# Patient Record
Sex: Male | Born: 1980 | Race: White | Hispanic: No | Marital: Single | Smoking: Current every day smoker
Health system: Southern US, Community
[De-identification: ages and names within clinical notes are randomized; demographics above are authoritative.]

---

## 2012-09-14 ENCOUNTER — Emergency Department (HOSPITAL_COMMUNITY): Payer: Self-pay

## 2012-09-14 ENCOUNTER — Emergency Department (HOSPITAL_COMMUNITY)
Admission: EM | Admit: 2012-09-14 | Discharge: 2012-09-14 | Disposition: A | Discharge status: Home / Self Care | Payer: Self-pay | Attending: Emergency Medicine | Admitting: Emergency Medicine

## 2012-09-14 ENCOUNTER — Encounter (HOSPITAL_COMMUNITY): Payer: Self-pay | Admitting: Emergency Medicine

## 2012-09-14 DIAGNOSIS — S298XXA Other specified injuries of thorax, initial encounter: Secondary | ICD-10-CM | POA: Insufficient documentation

## 2012-09-14 DIAGNOSIS — S0181XA Laceration without foreign body of other part of head, initial encounter: Secondary | ICD-10-CM

## 2012-09-14 DIAGNOSIS — R110 Nausea: Secondary | ICD-10-CM | POA: Insufficient documentation

## 2012-09-14 DIAGNOSIS — S0180XA Unspecified open wound of other part of head, initial encounter: Secondary | ICD-10-CM | POA: Insufficient documentation

## 2012-09-14 DIAGNOSIS — S022XXA Fracture of nasal bones, initial encounter for closed fracture: Secondary | ICD-10-CM | POA: Insufficient documentation

## 2012-09-14 LAB — URINE MICROSCOPIC-ADD ON

## 2012-09-14 LAB — URINALYSIS, ROUTINE W REFLEX MICROSCOPIC
Bilirubin Urine: NEGATIVE
Glucose, UA: NEGATIVE mg/dL
Ketones, ur: 40 mg/dL — AB
Protein, ur: NEGATIVE mg/dL

## 2012-09-14 MED ORDER — TETANUS-DIPHTH-ACELL PERTUSSIS 5-2.5-18.5 LF-MCG/0.5 IM SUSP
0.5000 mL | Freq: Once | INTRAMUSCULAR | Status: DC
  Filled 2012-09-14: qty 0.5

## 2012-09-14 MED ORDER — HYDROCODONE-ACETAMINOPHEN 5-325 MG PO TABS: 1.0000 | ORAL_TABLET | ORAL | Status: DC | PRN

## 2012-09-14 MED ORDER — ONDANSETRON 4 MG PO TBDP
8.0000 mg | ORAL_TABLET | Freq: Once | ORAL | Status: AC
  Administered 2012-09-14: 8 mg via ORAL
  Filled 2012-09-14: qty 2

## 2012-09-14 MED ORDER — OXYCODONE-ACETAMINOPHEN 5-325 MG PO TABS
2.0000 | ORAL_TABLET | Freq: Once | ORAL | Status: AC
  Administered 2012-09-14: 2 via ORAL
  Filled 2012-09-14: qty 2
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
## 2012-09-14 NOTE — ED Notes (Signed)
Patient back from CT; currently sitting up in bed; no respiratory or acute distress noted.  Patient updated on plan of care; informed patient that we are currently waiting on x-ray results to come back.  Patient given water, per request (OK by EDP).  Patient denies any other needs at this time; will continue to monitor.
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
## 2012-09-14 NOTE — ED Notes (Signed)
Pt given basin with water and peroxide to clean face off before MD sutures. sts understands.

## 2012-09-14 NOTE — ED Notes (Signed)
PA at bedside suturing.

## 2012-09-14 NOTE — ED Notes (Signed)
Patient currently sitting up in bed; no respiratory or acute distress noted.  Patient updated on plan of care; informed patient that he will be sent for CT scans and x-rays; patient denies any needs at this time; will continue to monitor.

## 2012-09-14 NOTE — ED Notes (Signed)
Patient currently asleep in bed; no respiratory or acute distress noted. Patient pending EDP exam at this time; will continue to monitor.
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
## 2014-02-21 ENCOUNTER — Encounter (HOSPITAL_COMMUNITY): Payer: Self-pay | Admitting: Emergency Medicine

## 2014-02-21 ENCOUNTER — Emergency Department (HOSPITAL_COMMUNITY): Payer: Self-pay

## 2014-02-21 ENCOUNTER — Emergency Department (HOSPITAL_COMMUNITY)
Admission: EM | Admit: 2014-02-21 | Discharge: 2014-02-21 | Disposition: A | Discharge status: Home / Self Care | Payer: Self-pay | Attending: Emergency Medicine | Admitting: Emergency Medicine

## 2014-02-21 DIAGNOSIS — S61311A Laceration without foreign body of left index finger with damage to nail, initial encounter: Secondary | ICD-10-CM

## 2014-02-21 DIAGNOSIS — F17200 Nicotine dependence, unspecified, uncomplicated: Secondary | ICD-10-CM | POA: Insufficient documentation

## 2014-02-21 DIAGNOSIS — Z23 Encounter for immunization: Secondary | ICD-10-CM | POA: Insufficient documentation

## 2014-02-21 DIAGNOSIS — S61213A Laceration without foreign body of left middle finger without damage to nail, initial encounter: Secondary | ICD-10-CM

## 2014-02-21 DIAGNOSIS — Z79899 Other long term (current) drug therapy: Secondary | ICD-10-CM | POA: Insufficient documentation

## 2014-02-21 DIAGNOSIS — S61209A Unspecified open wound of unspecified finger without damage to nail, initial encounter: Secondary | ICD-10-CM | POA: Insufficient documentation

## 2014-02-21 DIAGNOSIS — S61212A Laceration without foreign body of right middle finger without damage to nail, initial encounter: Secondary | ICD-10-CM

## 2014-02-21 LAB — BASIC METABOLIC PANEL
Anion gap: 17 — ABNORMAL HIGH (ref 5–15)
BUN: 11 mg/dL (ref 6–23)
CALCIUM: 8.2 mg/dL — AB (ref 8.4–10.5)
CO2: 19 mEq/L (ref 19–32)
Chloride: 107 mEq/L (ref 96–112)
Creatinine, Ser: 0.89 mg/dL (ref 0.50–1.35)
GLUCOSE: 118 mg/dL — AB (ref 70–99)
POTASSIUM: 4.1 meq/L (ref 3.7–5.3)
SODIUM: 143 meq/L (ref 137–147)

## 2014-02-21 LAB — CBC WITH DIFFERENTIAL/PLATELET
BASOS ABS: 0 10*3/uL (ref 0.0–0.1)
BASOS PCT: 0 % (ref 0–1)
EOS ABS: 0 10*3/uL (ref 0.0–0.7)
EOS PCT: 0 % (ref 0–5)
HCT: 38.1 % — ABNORMAL LOW (ref 39.0–52.0)
Hemoglobin: 13.3 g/dL (ref 13.0–17.0)
LYMPHS ABS: 2.2 10*3/uL (ref 0.7–4.0)
Lymphocytes Relative: 13 % (ref 12–46)
MCH: 32.3 pg (ref 26.0–34.0)
MCHC: 34.9 g/dL (ref 30.0–36.0)
MCV: 92.5 fL (ref 78.0–100.0)
Monocytes Absolute: 1.2 10*3/uL — ABNORMAL HIGH (ref 0.1–1.0)
Monocytes Relative: 7 % (ref 3–12)
NEUTROS PCT: 80 % — AB (ref 43–77)
Neutro Abs: 14.1 10*3/uL — ABNORMAL HIGH (ref 1.7–7.7)
PLATELETS: 200 10*3/uL (ref 150–400)
RBC: 4.12 MIL/uL — ABNORMAL LOW (ref 4.22–5.81)
RDW: 12 % (ref 11.5–15.5)
WBC: 17.6 10*3/uL — ABNORMAL HIGH (ref 4.0–10.5)

## 2014-02-21 MED ORDER — FENTANYL CITRATE 0.05 MG/ML IJ SOLN
50.0000 ug | Freq: Once | INTRAMUSCULAR | Status: AC
  Administered 2014-02-21: 50 ug via INTRAVENOUS
  Filled 2014-02-21: qty 2

## 2014-02-21 MED ORDER — ONDANSETRON 4 MG PO TBDP
8.0000 mg | ORAL_TABLET | Freq: Once | ORAL | Status: AC
  Administered 2014-02-21: 8 mg via ORAL
  Filled 2014-02-21: qty 2

## 2014-02-21 MED ORDER — ONDANSETRON 8 MG PO TBDP: 8.0000 mg | ORAL_TABLET | Freq: Three times a day (TID) | ORAL | Status: AC | PRN

## 2014-02-21 MED ORDER — CEPHALEXIN 500 MG PO CAPS: 500.0000 mg | ORAL_CAPSULE | Freq: Four times a day (QID) | ORAL | Status: AC

## 2014-02-21 MED ORDER — SODIUM CHLORIDE 0.9 % IV BOLUS (SEPSIS)
1000.0000 mL | Freq: Once | INTRAVENOUS | Status: AC
  Administered 2014-02-21: 1000 mL via INTRAVENOUS

## 2014-02-21 MED ORDER — TETANUS-DIPHTH-ACELL PERTUSSIS 5-2.5-18.5 LF-MCG/0.5 IM SUSP
0.5000 mL | Freq: Once | INTRAMUSCULAR | Status: AC
  Administered 2014-02-21: 0.5 mL via INTRAMUSCULAR
  Filled 2014-02-21: qty 0.5

## 2014-02-21 MED ORDER — OXYCODONE-ACETAMINOPHEN 5-325 MG PO TABS: 2.0000 | ORAL_TABLET | ORAL | Status: AC | PRN

## 2014-02-21 NOTE — ED Notes (Signed)
Kerlix and coban applied to each sutured digit after cleaning with normal saline, and reveiwed with patient how to care for extremities at home.  Provided extra dressings for use also.

## 2014-02-21 NOTE — ED Notes (Signed)
Called Dr. Norlene Campbell in regards to patient's bleeding with dressing in place, redressed for control. She will see him soon.

## 2014-02-21 NOTE — ED Notes (Signed)
Dr. Norlene Campbelltter at the bedside to assess hands, patient placed in [place] and 1 bolus of NS started due to BP of 87/56. P 97

## 2014-02-21 NOTE — ED Notes (Signed)
Patient still off the unit for testing 

## 2014-02-21 NOTE — ED Provider Notes (Signed)
5:27 AM  LACERATION REPAIR Performed by: Emilia BeckKaitlyn Wesson Stith Authorized by: Emilia BeckKaitlyn Juvenal Umar Consent: Verbal consent obtained. Risks and benefits: risks, benefits and alternatives were discussed Consent given by: patient Patient identity confirmed: provided demographic data Prepped and Draped in normal sterile fashion Wound explored  Laceration Location: left dorsal middle finger  Laceration Length: 5 cm  No Foreign Bodies seen or palpated  Anesthesia: local infiltration  Local anesthetic: lidocaine 1% without epinephrine  Anesthetic total: 2 ml  Irrigation method: syringe Amount of cleaning: standard  Skin closure: 4-0 prolene  Number of sutures: 5  Technique: simple  Patient tolerance: Patient tolerated the procedure well with no immediate complications.  LACERATION REPAIR Performed by: Emilia BeckKaitlyn Caralyn Twining Authorized by: Emilia BeckKaitlyn Aashika Carta Consent: Verbal consent obtained. Risks and benefits: risks, benefits and alternatives were discussed Consent given by: patient Patient identity confirmed: provided demographic data Prepped and Draped in normal sterile fashion Wound explored  Laceration Location: right distal middle finger  Laceration Length: 3 cm  No Foreign Bodies seen or palpated  Anesthesia: local infiltration  Local anesthetic: lidocaine 1% without epinephrine  Anesthetic total: 2 ml  Irrigation method: syringe Amount of cleaning: standard  Skin closure: 4-0 prolene  Number of sutures: 3  Technique: simple  Patient tolerance: Patient tolerated the procedure well with no immediate complications.   Emilia BeckKaitlyn Marvel Sapp, [place] JerseyPA-C 02/21/14 [phone]

## 2014-02-21 NOTE — ED Notes (Signed)
Reinforced dressing over left hand to control blood.

## 2014-02-21 NOTE — ED Notes (Signed)
Patient has called his father and provided him the last for of med rec number for visitation privilages.  Father's name is "Reuel BoomDaniel" or "Dan".

## 2014-02-21 NOTE — Discharge Instructions (Signed)
Watch for signs of infection:  Redness, swelling, drainage of pus.  Take medications as prescribed.  Follow up with Dr Izora Ribas, call on Monday for appointment.  Suture removal in 5-7 days   Fingernail or Toenail Loss All or part of your fingernail or toenail has been lost. This may or may not grow back as a normal nail. A special non-stick bandage has been put on your finger or toe tightly to prevent bleeding. HOME CARE INSTRUCTIONS  The tips of fingers and toes are full of nerves and injuries are often very painful. The following will help you decrease the pain and obtain the best outcome.  Keep your hand or foot elevated above your heart to relieve pain and swelling. This will require lying in bed or on a couch with the hand or leg on pillows or sitting in a recliner with the leg up. Letting your hand or leg dangle may increase swelling, slow healing and cause throbbing pain.  Keep your dressing dry and clean.  Change your bandage in 24 hours after going home.  After your bandage is changed, soak your hand or foot in warm soapy water for 10 to 20 minutes. Do this 3 times per day. This helps reduce pain and swelling. After soaking, apply a clean, dry bandage. Change your bandage if it is wet or dirty.  Only take over-the-counter or prescription medicines for pain, discomfort, or fever as directed by your caregiver.  See your caregiver as needed for problems. SEEK IMMEDIATE MEDICAL CARE IF:   You have increased pain, swelling, drainage, or bleeding.  You have a fever. MAKE SURE YOU:   Understand these instructions.  Will watch your condition.  Will get help right away if you are not doing well or get worse. Document Released: 05/11/2006 Document Revised: 09/11/2011 Document Reviewed: 07/31/2006 [facility] Patient Information 2015 [place], [place]. This information is not intended to replace advice given to you by your health care provider. Make sure you discuss any questions you have with  your health care provider.  Delayed Wound Closure Sometimes, your health care provider will decide to delay closing a wound for several days. This is done when the wound is badly bruised, dirty, or when it has been several hours since the injury happened. By delaying the closure of your wound, the risk of infection is reduced. Wounds that are closed in 3-7 days after being cleaned up and dressed heal just as well as those that are closed right away. HOME CARE INSTRUCTIONS  Rest and elevate the injured area until the pain and swelling are gone.  Have your wound checked as instructed by your health care provider. SEEK MEDICAL CARE IF:  You develop unusual or increased swelling or redness around the wound.  You have increasing pain or tenderness.  There is increasing fluid (drainage) or a bad smelling drainage coming from the wound. Document Released: 06/19/2005 Document Revised: 06/24/2013 Document Reviewed: 12/17/2012 [facility] Patient Information 2015 [place], [place]. This information is not intended to replace advice given to you by your health care provider. Make sure you discuss any questions you have with your health care provider.  Finger Avulsion  When the tip of the finger is lost, a new nail may grow back if part of the fingernail is left. The new nail may be deformed. If just the tip of the finger is lost, no repair may be needed unless there is bone showing. If bone is showing, your caregiver may need to remove the protruding bone  and put on a bandage. Your caregiver will do what is best for you. Most of the time when a fingertip is lost, the end will gradually grow back on and look fairly normal, but it may remain sensitive to pressure and temperature extremes for a long time. HOME CARE INSTRUCTIONS   Keep your hand elevated above your heart to relieve pain and swelling.  Keep your dressing dry and clean.  Change your bandage in 24 hours or as directed.  Only take  over-the-counter or prescription medicines for pain, discomfort, or fever as directed by your caregiver.  See your caregiver as needed for problems. SEEK MEDICAL CARE IF:   You have increased pain, swelling, drainage, or bleeding.  You have a fever.  You have swelling that spreads from your finger and into your hand. Make sure to check to see if you need a tetanus booster. Document Released: 08/28/2001 Document Revised: 12/19/2011 Document Reviewed: 07/23/2008 [facility] Patient Information 2015 [place], [place]. This information is not intended to replace advice given to you by your health care provider. Make sure you discuss any questions you have with your health care provider.  Laceration Care, Adult A laceration is a cut or lesion that goes through all layers of the skin and into the tissue just beneath the skin. TREATMENT  Some lacerations may not require closure. Some lacerations may not be able to be closed due to an increased risk of infection. It is important to see your caregiver as soon as possible after an injury to minimize the risk of infection and maximize the opportunity for successful closure. If closure is appropriate, pain medicines may be given, if needed. The wound will be cleaned to help prevent infection. Your caregiver will use stitches (sutures), staples, wound glue (adhesive), or skin adhesive strips to repair the laceration. These tools bring the skin edges together to allow for faster healing and a better cosmetic outcome. However, all wounds will heal with a scar. Once the wound has healed, scarring can be minimized by covering the wound with sunscreen during the day for 1 full year. HOME CARE INSTRUCTIONS  For sutures or staples:  Keep the wound clean and dry.  If you were given a bandage (dressing), you should change it at least once a day. Also, change the dressing if it becomes wet or dirty, or as directed by your caregiver.  Wash the wound with soap and water  2 times a day. Rinse the wound off with water to remove all soap. Pat the wound dry with a clean towel.  After cleaning, apply a thin layer of the antibiotic ointment as recommended by your caregiver. This will help prevent infection and keep the dressing from sticking.  You may shower as usual after the first 24 hours. Do not soak the wound in water until the sutures are removed.  Only take over-the-counter or prescription medicines for pain, discomfort, or fever as directed by your caregiver.  Get your sutures or staples removed as directed by your caregiver. For skin adhesive strips:  Keep the wound clean and dry.  Do not get the skin adhesive strips wet. You may bathe carefully, using caution to keep the wound dry.  If the wound gets wet, pat it dry with a clean towel.  Skin adhesive strips will fall off on their own. You may trim the strips as the wound heals. Do not remove skin adhesive strips that are still stuck to the wound. They will fall off in time. For  wound adhesive:  You may briefly wet your wound in the shower or bath. Do not soak or scrub the wound. Do not swim. Avoid periods of heavy perspiration until the skin adhesive has fallen off on its own. After showering or bathing, gently pat the wound dry with a clean towel.  Do not apply liquid medicine, cream medicine, or ointment medicine to your wound while the skin adhesive is in place. This may loosen the film before your wound is healed.  If a dressing is placed over the wound, be careful not to apply tape directly over the skin adhesive. This may cause the adhesive to be pulled off before the wound is healed.  Avoid prolonged exposure to sunlight or tanning lamps while the skin adhesive is in place. Exposure to ultraviolet light in the first year will darken the scar.  The skin adhesive will usually remain in place for 5 to 10 days, then naturally fall off the skin. Do not pick at the adhesive film. You may need a  tetanus shot if:  You cannot remember when you had your last tetanus shot.  You have never had a tetanus shot. If you get a tetanus shot, your arm may swell, get red, and feel warm to the touch. This is common and not a problem. If you need a tetanus shot and you choose not to have one, there is a rare chance of getting tetanus. Sickness from tetanus can be serious. SEEK MEDICAL CARE IF:   You have redness, swelling, or increasing pain in the wound.  You see a red line that goes away from the wound.  You have yellowish-white fluid (pus) coming from the wound.  You have a fever.  You notice a bad smell coming from the wound or dressing.  Your wound breaks open before or after sutures have been removed.  You notice something coming out of the wound such as wood or glass.  Your wound is on your hand or foot and you cannot move a finger or toe. SEEK IMMEDIATE MEDICAL CARE IF:   Your pain is not controlled with prescribed medicine.  You have severe swelling around the wound causing pain and numbness or a change in color in your arm, hand, leg, or foot.  Your wound splits open and starts bleeding.  You have worsening numbness, weakness, or loss of function of any joint around or beyond the wound.  You develop painful lumps near the wound or on the skin anywhere on your body. MAKE SURE YOU:   Understand these instructions.  Will watch your condition.  Will get help right away if you are not doing well or get worse. Document Released: 06/19/2005 Document Revised: 09/11/2011 Document Reviewed: 12/13/2010 [facility] Patient Information 2015 [place], [place]. This information is not intended to replace advice given to you by your health care provider. Make sure you discuss any questions you have with your health care provider.

## 2014-02-21 NOTE — ED Notes (Signed)
Discussed with Dr. Norlene Campbelltter bp of 107/60. MD allows patient to receive fentanyl after return from x-ray based on BP.
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
## 2014-02-23 NOTE — ED Provider Notes (Signed)
Medical screening examination/treatment/procedure(s) were conducted as a shared visit with non-physician practitioner(s) and myself.  I personally evaluated the patient during the encounter.   EKG Interpretation None     Pt was seen by me primarily, repair done by the PA. Please see my note for documentation.  Olivia Mackie, MD 02/23/14 [phone]

## 2015-03-31 IMAGING — CR DG HAND COMPLETE 3+V*L*
3 series · 3 of 3 positions shown · non-contrast
Comparison: None.

CLINICAL DATA: Laceration of the index and middle fingers

EXAM:
LEFT HAND - COMPLETE 3+ VIEW

[x hand pa left]
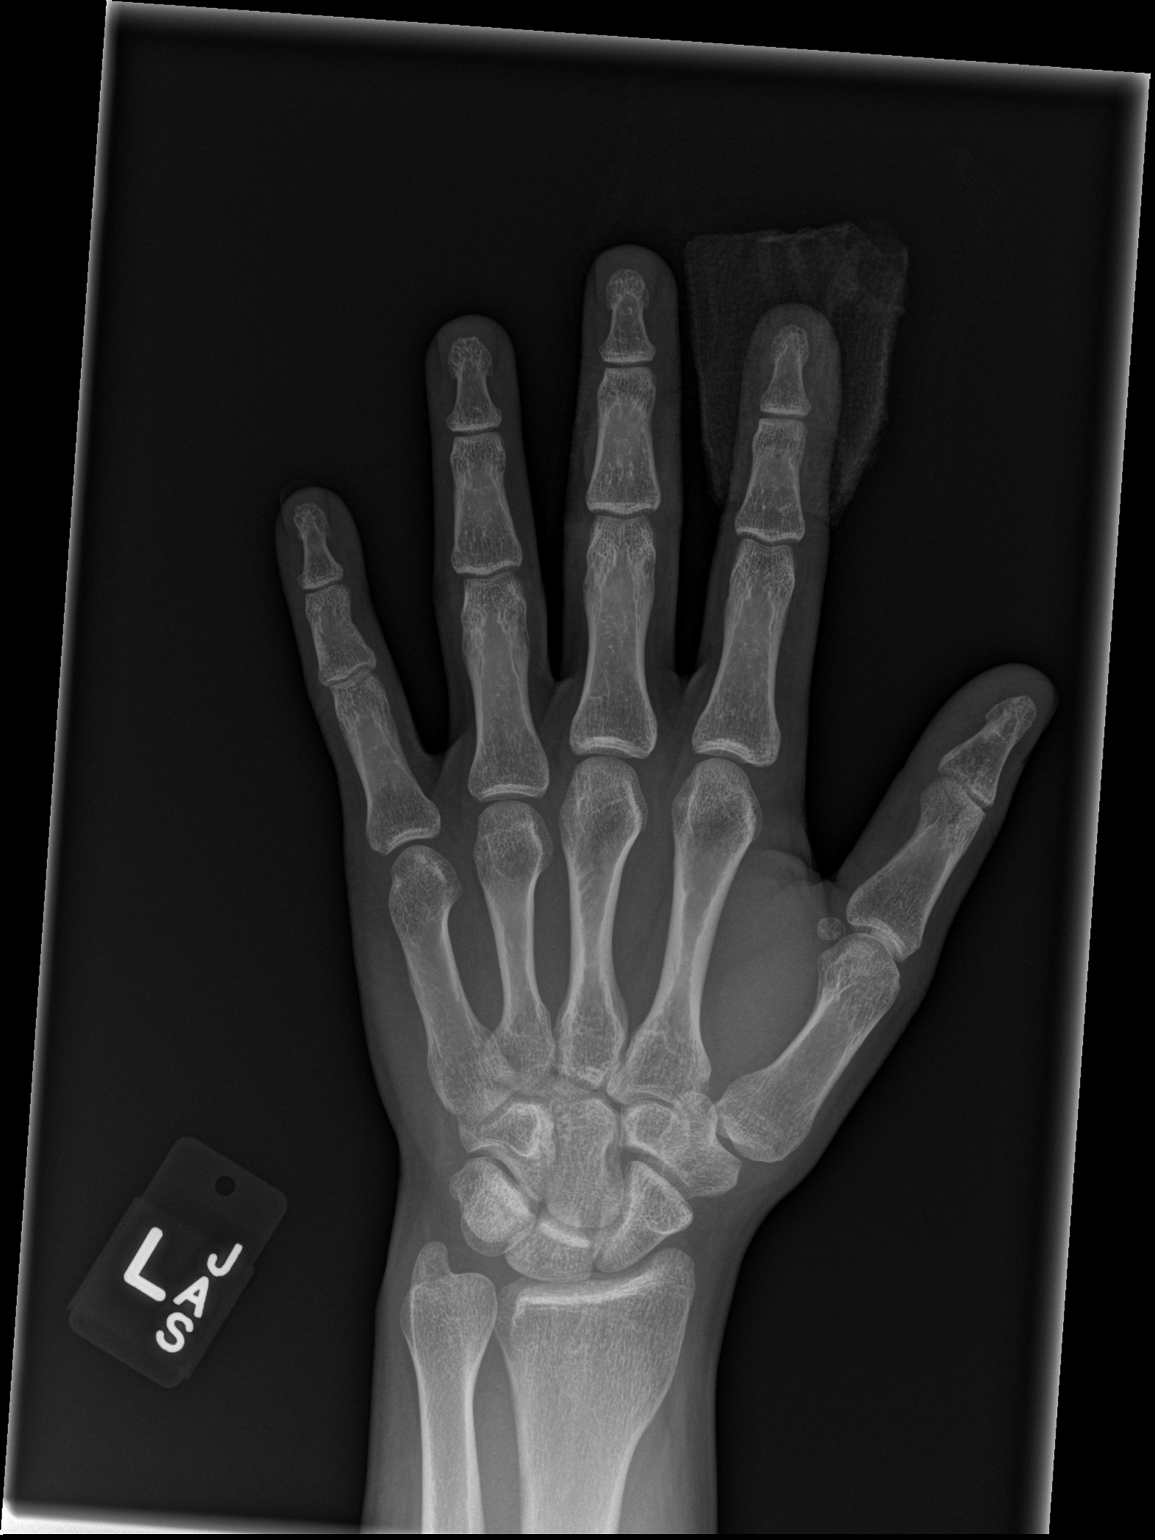

[x hand obl left]
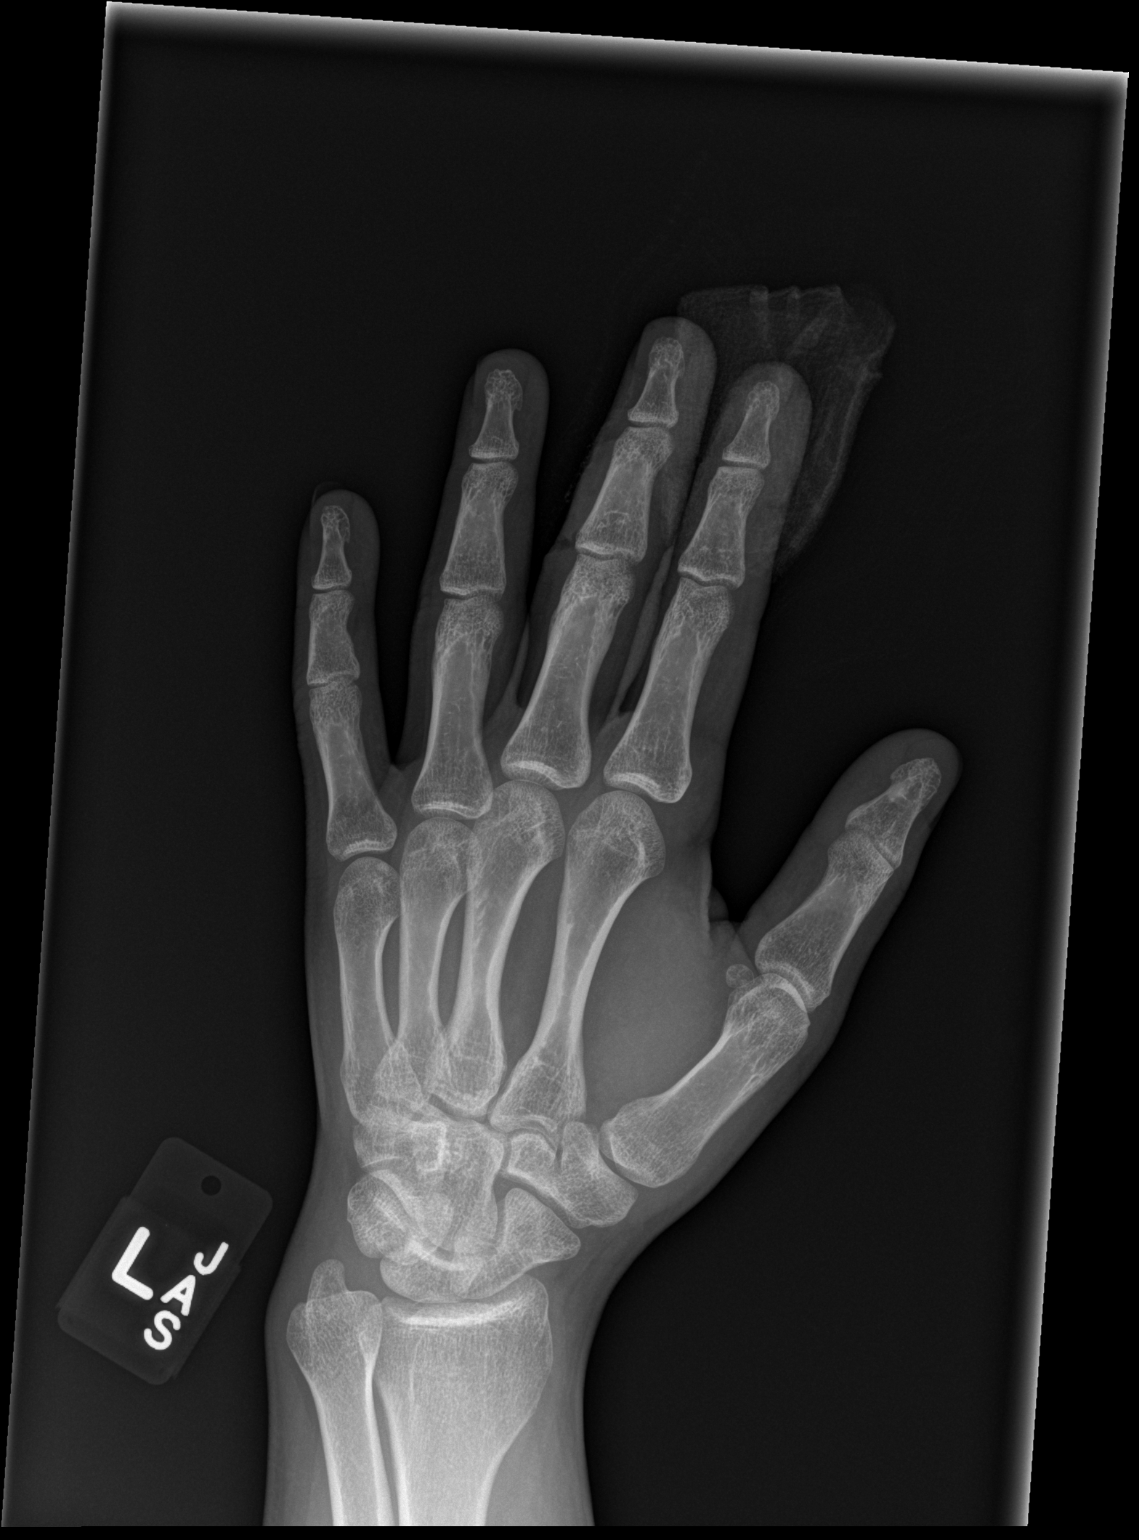

[x hand lat left]
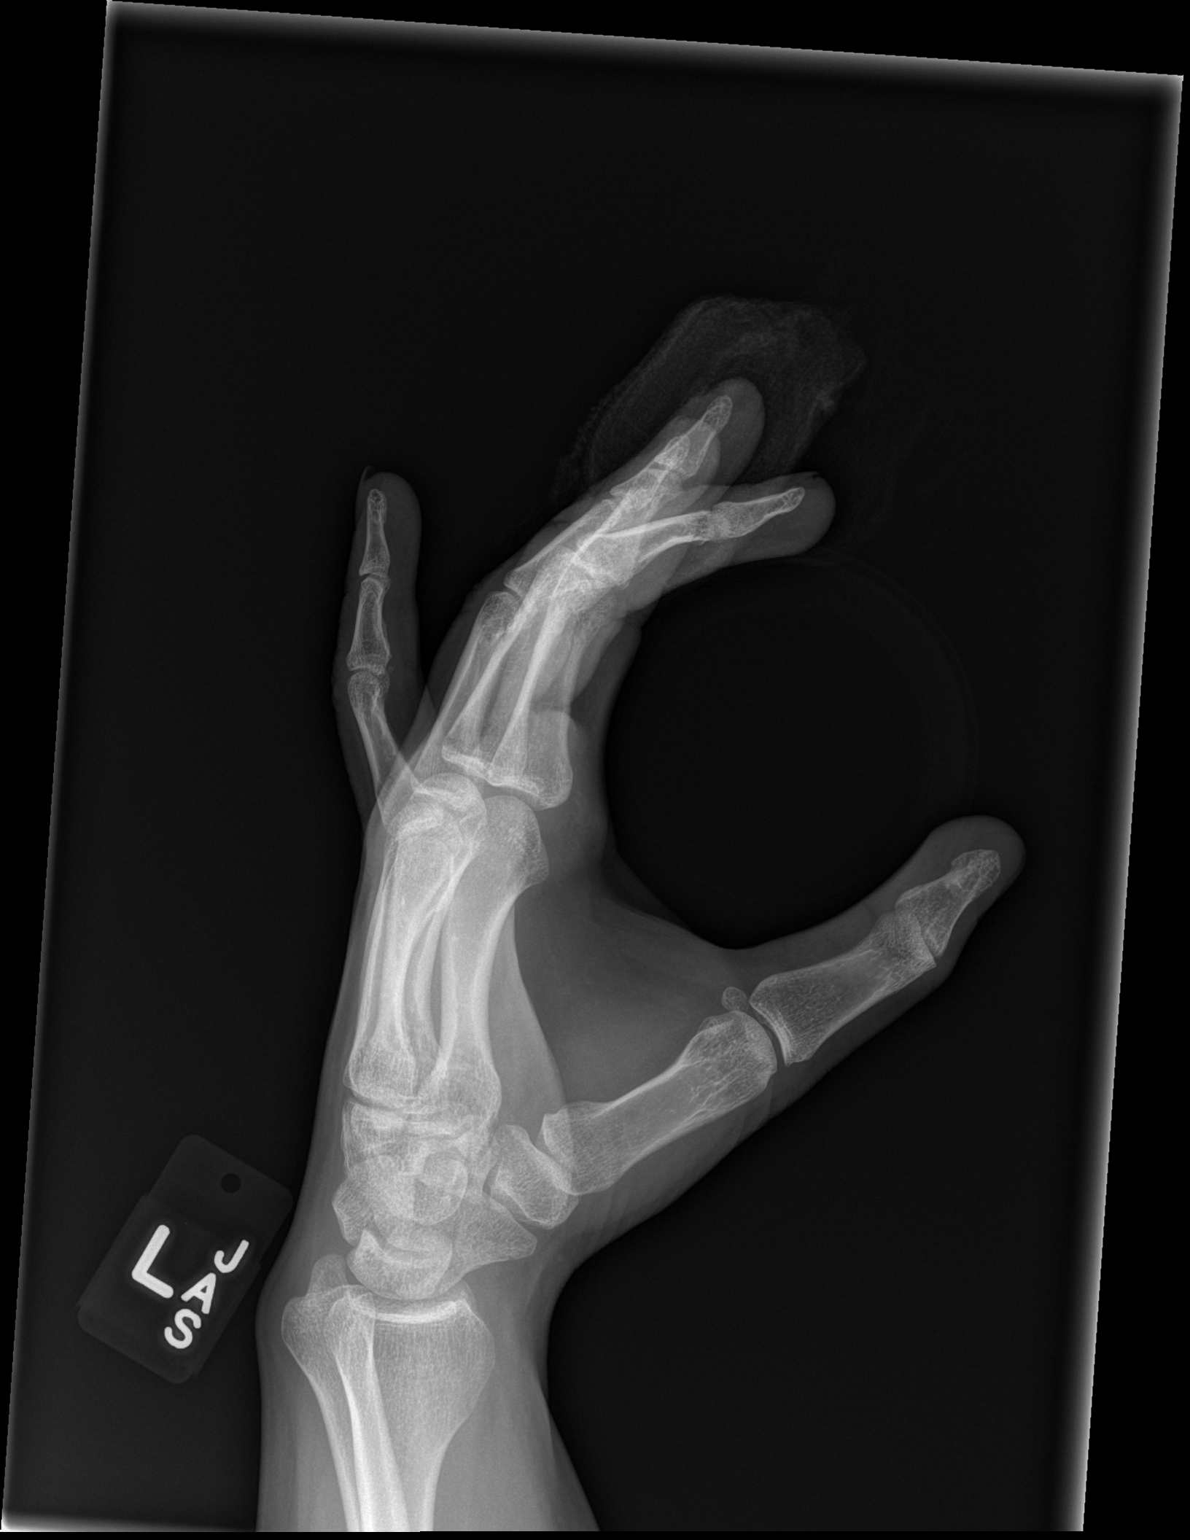

[3 of 3 positions shown; findings below may reference images not displayed]

FINDINGS: Soft tissue irregularity of the index and long fingers consistent
with laceration. As permitted by overlapping bandage, no internal
foreign body. No acute fracture or malalignment.
IMPRESSION: No acute osseous findings.
